# Patient Record
Sex: Female | Born: 1985 | Race: White | Hispanic: No | Marital: Married | Smoking: Never smoker
Health system: Southern US, Community
[De-identification: ages and names within clinical notes are randomized; demographics above are authoritative.]

## PROBLEM LIST (undated history)

## (undated) DIAGNOSIS — F419 Anxiety disorder, unspecified: Secondary | ICD-10-CM

## (undated) DIAGNOSIS — R87629 Unspecified abnormal cytological findings in specimens from vagina: Secondary | ICD-10-CM

## (undated) DIAGNOSIS — F99 Mental disorder, not otherwise specified: Secondary | ICD-10-CM

## (undated) HISTORY — PX: BREAST SURGERY: SHX581

## (undated) HISTORY — PX: COLPOSCOPY: SHX161

---
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
## 2006-03-21 ENCOUNTER — Emergency Department (HOSPITAL_COMMUNITY): Admission: EM | Admit: 2006-03-21 | Discharge: 2006-03-21 | Payer: Self-pay | Admitting: Emergency Medicine

## 2008-05-30 ENCOUNTER — Emergency Department (HOSPITAL_COMMUNITY): Admission: EM | Admit: 2008-05-30 | Discharge: 2008-05-30 | Payer: Self-pay | Admitting: Emergency Medicine

## 2011-06-05 LAB — POCT PREGNANCY, URINE: Preg Test, Ur: NEGATIVE

## 2011-06-05 LAB — URINALYSIS, ROUTINE W REFLEX MICROSCOPIC
Bilirubin Urine: NEGATIVE
Glucose, UA: NEGATIVE
Ketones, ur: NEGATIVE
Nitrite: NEGATIVE
Protein, ur: 100 — AB
Specific Gravity, Urine: 1.023
Urobilinogen, UA: 1
pH: 7

## 2011-06-05 LAB — URINE MICROSCOPIC-ADD ON

## 2011-06-05 LAB — URINE CULTURE: Colony Count: >=100000

## 2017-09-18 LAB — OB RESULTS CONSOLE ABO/RH: RH Type: POSITIVE

## 2017-09-18 LAB — OB RESULTS CONSOLE RPR: RPR: NONREACTIVE

## 2017-09-18 LAB — OB RESULTS CONSOLE RUBELLA ANTIBODY, IGM: Rubella: IMMUNE

## 2017-09-18 LAB — OB RESULTS CONSOLE HIV ANTIBODY (ROUTINE TESTING): HIV: NONREACTIVE

## 2017-09-18 LAB — OB RESULTS CONSOLE HEPATITIS B SURFACE ANTIGEN: Hepatitis B Surface Ag: NEGATIVE

## 2017-09-18 LAB — OB RESULTS CONSOLE ANTIBODY SCREEN: Antibody Screen: NEGATIVE

## 2018-03-05 ENCOUNTER — Inpatient Hospital Stay (HOSPITAL_COMMUNITY): Payer: 59 | Admitting: Anesthesiology

## 2018-03-05 ENCOUNTER — Inpatient Hospital Stay (HOSPITAL_COMMUNITY)
Admission: AD | Admit: 2018-03-05 | Discharge: 2018-03-08 | DRG: 788 | Disposition: A | Discharge status: Home / Self Care | Payer: 59 | Source: Ambulatory Visit | Attending: Obstetrics & Gynecology | Admitting: Obstetrics & Gynecology

## 2018-03-05 ENCOUNTER — Encounter (HOSPITAL_COMMUNITY): Payer: Self-pay

## 2018-03-05 ENCOUNTER — Encounter (HOSPITAL_COMMUNITY)
Admission: AD | Disposition: A | Discharge status: Home / Self Care | Payer: Self-pay | Source: Ambulatory Visit | Attending: Obstetrics & Gynecology

## 2018-03-05 DIAGNOSIS — O4292 Full-term premature rupture of membranes, unspecified as to length of time between rupture and onset of labor: Principal | ICD-10-CM | POA: Diagnosis present

## 2018-03-05 DIAGNOSIS — Z3A37 37 weeks gestation of pregnancy: Secondary | ICD-10-CM | POA: Diagnosis not present

## 2018-03-05 DIAGNOSIS — Z98891 History of uterine scar from previous surgery: Secondary | ICD-10-CM

## 2018-03-05 HISTORY — DX: Anxiety disorder, unspecified: F41.9

## 2018-03-05 HISTORY — DX: Mental disorder, not otherwise specified: F99

## 2018-03-05 HISTORY — DX: Unspecified abnormal cytological findings in specimens from vagina: R87.629

## 2018-03-05 LAB — RPR: RPR Ser Ql: NONREACTIVE

## 2018-03-05 LAB — ABO/RH: ABO/RH(D): A POS

## 2018-03-05 LAB — OB RESULTS CONSOLE GBS: STREP GROUP B AG: NEGATIVE

## 2018-03-05 LAB — CBC
HEMATOCRIT: 31.5 % — AB (ref 36.0–46.0)
HEMOGLOBIN: 11.1 g/dL — AB (ref 12.0–15.0)
MCH: 30.2 pg (ref 26.0–34.0)
MCHC: 35.2 g/dL (ref 30.0–36.0)
MCV: 85.8 fL (ref 78.0–100.0)
Platelets: 226 10*3/uL (ref 150–400)
RBC: 3.67 MIL/uL — ABNORMAL LOW (ref 3.87–5.11)
RDW: 12.8 % (ref 11.5–15.5)
WBC: 10.9 10*3/uL — AB (ref 4.0–10.5)

## 2018-03-05 LAB — TYPE AND SCREEN
ABO/RH(D): A POS
Antibody Screen: NEGATIVE

## 2018-03-05 LAB — POCT FERN TEST: POCT Fern Test: POSITIVE

## 2018-03-05 LAB — GROUP B STREP BY PCR: GROUP B STREP BY PCR: NEGATIVE

## 2018-03-05 SURGERY — Surgical Case
Anesthesia: Epidural

## 2018-03-05 MED ORDER — ACETAMINOPHEN 325 MG PO TABS: 650.0000 mg | ORAL_TABLET | ORAL | Status: DC | PRN

## 2018-03-05 MED ORDER — EPHEDRINE 5 MG/ML INJ: 10.0000 mg | INTRAVENOUS | Status: DC | PRN

## 2018-03-05 MED ORDER — DEXAMETHASONE SODIUM PHOSPHATE 10 MG/ML IJ SOLN
INTRAMUSCULAR | Status: AC
  Filled 2018-03-05: qty 1

## 2018-03-05 MED ORDER — MEPERIDINE HCL 25 MG/ML IJ SOLN: 6.2500 mg | INTRAMUSCULAR | Status: DC | PRN

## 2018-03-05 MED ORDER — PHENYLEPHRINE 40 MCG/ML (10ML) SYRINGE FOR IV PUSH (FOR BLOOD PRESSURE SUPPORT): 80.0000 ug | PREFILLED_SYRINGE | INTRAVENOUS | Status: DC | PRN

## 2018-03-05 MED ORDER — FENTANYL 2.5 MCG/ML BUPIVACAINE 1/10 % EPIDURAL INFUSION (WH - ANES)
INTRAMUSCULAR | Status: AC
  Filled 2018-03-05: qty 100

## 2018-03-05 MED ORDER — TERBUTALINE SULFATE 1 MG/ML IJ SOLN: 0.2500 mg | Freq: Once | INTRAMUSCULAR | Status: DC | PRN

## 2018-03-05 MED ORDER — LACTATED RINGERS IV SOLN: 500.0000 mL | Freq: Once | INTRAVENOUS | Status: DC

## 2018-03-05 MED ORDER — OXYTOCIN 40 UNITS IN LACTATED RINGERS INFUSION - SIMPLE MED
2.5000 [IU]/h | INTRAVENOUS | Status: DC
  Filled 2018-03-05: qty 1000

## 2018-03-05 MED ORDER — OXYCODONE-ACETAMINOPHEN 5-325 MG PO TABS: 2.0000 | ORAL_TABLET | ORAL | Status: DC | PRN

## 2018-03-05 MED ORDER — SOD CITRATE-CITRIC ACID 500-334 MG/5ML PO SOLN
30.0000 mL | ORAL | Status: DC | PRN
  Administered 2018-03-05: 30 mL via ORAL
  Filled 2018-03-05: qty 15

## 2018-03-05 MED ORDER — LIDOCAINE-EPINEPHRINE (PF) 2 %-1:200000 IJ SOLN
INTRAMUSCULAR | Status: DC | PRN
  Administered 2018-03-05 (×3): 5 mL via EPIDURAL

## 2018-03-05 MED ORDER — OXYTOCIN 10 UNIT/ML IJ SOLN
INTRAMUSCULAR | Status: AC
  Filled 2018-03-05: qty 4

## 2018-03-05 MED ORDER — SCOPOLAMINE 1 MG/3DAYS TD PT72
MEDICATED_PATCH | TRANSDERMAL | Status: AC
  Filled 2018-03-05: qty 1

## 2018-03-05 MED ORDER — FENTANYL 2.5 MCG/ML BUPIVACAINE 1/10 % EPIDURAL INFUSION (WH - ANES)
14.0000 mL/h | INTRAMUSCULAR | Status: DC | PRN
  Administered 2018-03-05 (×3): 14 mL/h via EPIDURAL
  Filled 2018-03-05 (×2): qty 100

## 2018-03-05 MED ORDER — LIDOCAINE HCL (PF) 1 % IJ SOLN: 30.0000 mL | INTRAMUSCULAR | Status: DC | PRN

## 2018-03-05 MED ORDER — OXYTOCIN 10 UNIT/ML IJ SOLN
INTRAVENOUS | Status: DC | PRN
  Administered 2018-03-05: 40 [IU] via INTRAVENOUS

## 2018-03-05 MED ORDER — ONDANSETRON HCL 4 MG/2ML IJ SOLN: 4.0000 mg | Freq: Four times a day (QID) | INTRAMUSCULAR | Status: DC | PRN

## 2018-03-05 MED ORDER — PHENYLEPHRINE 40 MCG/ML (10ML) SYRINGE FOR IV PUSH (FOR BLOOD PRESSURE SUPPORT)
PREFILLED_SYRINGE | INTRAVENOUS | Status: AC
  Filled 2018-03-05: qty 10

## 2018-03-05 MED ORDER — DEXAMETHASONE SODIUM PHOSPHATE 10 MG/ML IJ SOLN
INTRAMUSCULAR | Status: DC | PRN
  Administered 2018-03-05: 10 mg via INTRAVENOUS

## 2018-03-05 MED ORDER — METOCLOPRAMIDE HCL 5 MG/ML IJ SOLN: 10.0000 mg | Freq: Once | INTRAMUSCULAR | Status: DC | PRN

## 2018-03-05 MED ORDER — LACTATED RINGERS IV SOLN: INTRAVENOUS | Status: DC

## 2018-03-05 MED ORDER — FENTANYL CITRATE (PF) 100 MCG/2ML IJ SOLN
25.0000 ug | INTRAMUSCULAR | Status: DC | PRN
  Administered 2018-03-06: 50 ug via INTRAVENOUS

## 2018-03-05 MED ORDER — MORPHINE SULFATE (PF) 0.5 MG/ML IJ SOLN
INTRAMUSCULAR | Status: DC | PRN
  Administered 2018-03-05: 4 mg via EPIDURAL

## 2018-03-05 MED ORDER — DIPHENHYDRAMINE HCL 50 MG/ML IJ SOLN: 12.5000 mg | INTRAMUSCULAR | Status: DC | PRN

## 2018-03-05 MED ORDER — MORPHINE SULFATE (PF) 0.5 MG/ML IJ SOLN
INTRAMUSCULAR | Status: AC
  Filled 2018-03-05: qty 10

## 2018-03-05 MED ORDER — LIDOCAINE HCL (PF) 1 % IJ SOLN
INTRAMUSCULAR | Status: DC | PRN
  Administered 2018-03-05 (×2): 5 mL via EPIDURAL

## 2018-03-05 MED ORDER — FLEET ENEMA 7-19 GM/118ML RE ENEM: 1.0000 | ENEMA | Freq: Every day | RECTAL | Status: DC | PRN

## 2018-03-05 MED ORDER — LIDOCAINE-EPINEPHRINE (PF) 2 %-1:200000 IJ SOLN
INTRAMUSCULAR | Status: AC
  Filled 2018-03-05: qty 20

## 2018-03-05 MED ORDER — MEPERIDINE HCL 25 MG/ML IJ SOLN
INTRAMUSCULAR | Status: DC | PRN
  Administered 2018-03-05 (×2): 12.5 mg via INTRAVENOUS

## 2018-03-05 MED ORDER — SCOPOLAMINE 1 MG/3DAYS TD PT72
MEDICATED_PATCH | TRANSDERMAL | Status: DC | PRN
  Administered 2018-03-05: 1 via TRANSDERMAL

## 2018-03-05 MED ORDER — CEFAZOLIN SODIUM-DEXTROSE 2-3 GM-%(50ML) IV SOLR
INTRAVENOUS | Status: DC | PRN
  Administered 2018-03-05: 2 g via INTRAVENOUS

## 2018-03-05 MED ORDER — ONDANSETRON HCL 4 MG/2ML IJ SOLN
INTRAMUSCULAR | Status: AC
  Filled 2018-03-05: qty 2

## 2018-03-05 MED ORDER — CEFAZOLIN SODIUM-DEXTROSE 2-4 GM/100ML-% IV SOLN: 2.0000 g | Freq: Once | INTRAVENOUS | Status: DC

## 2018-03-05 MED ORDER — OXYTOCIN BOLUS FROM INFUSION: 500.0000 mL | Freq: Once | INTRAVENOUS | Status: DC

## 2018-03-05 MED ORDER — LACTATED RINGERS IV SOLN
INTRAVENOUS | Status: DC
  Administered 2018-03-05 (×3): via INTRAVENOUS

## 2018-03-05 MED ORDER — ONDANSETRON HCL 4 MG/2ML IJ SOLN
INTRAMUSCULAR | Status: DC | PRN
  Administered 2018-03-05: 4 mg via INTRAVENOUS

## 2018-03-05 MED ORDER — OXYTOCIN 40 UNITS IN LACTATED RINGERS INFUSION - SIMPLE MED
1.0000 m[IU]/min | INTRAVENOUS | Status: DC
  Administered 2018-03-05: 2 m[IU]/min via INTRAVENOUS

## 2018-03-05 MED ORDER — LACTATED RINGERS IV SOLN: 500.0000 mL | INTRAVENOUS | Status: DC | PRN

## 2018-03-05 MED ORDER — SODIUM BICARBONATE 8.4 % IV SOLN
INTRAVENOUS | Status: AC
  Filled 2018-03-05: qty 50

## 2018-03-05 MED ORDER — OXYCODONE-ACETAMINOPHEN 5-325 MG PO TABS: 1.0000 | ORAL_TABLET | ORAL | Status: DC | PRN

## 2018-03-05 MED ORDER — CEFAZOLIN SODIUM-DEXTROSE 2-4 GM/100ML-% IV SOLN
INTRAVENOUS | Status: AC
  Filled 2018-03-05: qty 100

## 2018-03-05 MED ORDER — MEPERIDINE HCL 25 MG/ML IJ SOLN
INTRAMUSCULAR | Status: AC
  Filled 2018-03-05: qty 1

## 2018-03-05 MED ORDER — SODIUM CHLORIDE 0.9 % IR SOLN
Status: DC | PRN
  Administered 2018-03-05: 1

## 2018-03-05 SURGICAL SUPPLY — 36 items
BENZOIN TINCTURE PRP APPL 2/3 (GAUZE/BANDAGES/DRESSINGS) ×2 IMPLANT
CHLORAPREP W/TINT 26ML (MISCELLANEOUS) ×2 IMPLANT
CLAMP CORD UMBIL (MISCELLANEOUS) IMPLANT
CLOTH BEACON ORANGE TIMEOUT ST (SAFETY) ×2 IMPLANT
DERMABOND ADVANCED (GAUZE/BANDAGES/DRESSINGS)
DERMABOND ADVANCED .7 DNX12 (GAUZE/BANDAGES/DRESSINGS) IMPLANT
DRSG OPSITE POSTOP 4X10 (GAUZE/BANDAGES/DRESSINGS) ×2 IMPLANT
ELECT REM PT RETURN 9FT ADLT (ELECTROSURGICAL) ×2
ELECTRODE REM PT RTRN 9FT ADLT (ELECTROSURGICAL) ×1 IMPLANT
EXTRACTOR VACUUM KIWI (MISCELLANEOUS) IMPLANT
GLOVE BIO SURGEON STRL SZ 6 (GLOVE) ×2 IMPLANT
GLOVE BIOGEL PI IND STRL 6 (GLOVE) ×2 IMPLANT
GLOVE BIOGEL PI IND STRL 7.0 (GLOVE) ×1 IMPLANT
GLOVE BIOGEL PI INDICATOR 6 (GLOVE) ×2
GLOVE BIOGEL PI INDICATOR 7.0 (GLOVE) ×1
GOWN STRL REUS W/TWL LRG LVL3 (GOWN DISPOSABLE) ×4 IMPLANT
KIT ABG SYR 3ML LUER SLIP (SYRINGE) ×2 IMPLANT
NEEDLE HYPO 25X5/8 SAFETYGLIDE (NEEDLE) ×2 IMPLANT
NS IRRIG 1000ML POUR BTL (IV SOLUTION) ×2 IMPLANT
PACK C SECTION WH (CUSTOM PROCEDURE TRAY) ×2 IMPLANT
PAD OB MATERNITY 4.3X12.25 (PERSONAL CARE ITEMS) ×2 IMPLANT
PENCIL SMOKE EVAC W/HOLSTER (ELECTROSURGICAL) ×2 IMPLANT
STRIP CLOSURE SKIN 1/2X4 (GAUZE/BANDAGES/DRESSINGS) ×2 IMPLANT
SUT CHROMIC 0 CTX 36 (SUTURE) ×6 IMPLANT
SUT MON AB 2-0 CT1 27 (SUTURE) ×2 IMPLANT
SUT MON AB 2-0 CT1 36 (SUTURE) ×2 IMPLANT
SUT PDS AB 0 CT1 27 (SUTURE) IMPLANT
SUT PLAIN 0 NONE (SUTURE) IMPLANT
SUT VIC AB 0 CT1 36 (SUTURE) IMPLANT
SUT VIC AB 0 CTX 36 (SUTURE) ×1
SUT VIC AB 0 CTX36XBRD ANBCTRL (SUTURE) ×1 IMPLANT
SUT VIC AB 4-0 KS 27 (SUTURE) ×2 IMPLANT
SYR 50ML LL SCALE MARK (SYRINGE) ×2 IMPLANT
TOWEL OR 17X24 6PK STRL BLUE (TOWEL DISPOSABLE) ×2 IMPLANT
TRAY FOLEY W/BAG SLVR 14FR LF (SET/KITS/TRAYS/PACK) IMPLANT
WATER STERILE IRR 1000ML POUR (IV SOLUTION) ×2 IMPLANT

## 2018-03-05 NOTE — Anesthesia Pain Management Evaluation Note (Signed)
  CRNA Pain Management Visit Note  Patient: Sharon Mccoy, 32 y.o., female  "Hello I am a member of the anesthesia team at [facility]. We have an anesthesia team available at all times to provide care throughout the hospital, including epidural management and anesthesia for C-section. I don't know your plan for the delivery whether it a natural birth, water birth, IV sedation, nitrous supplementation, doula or epidural, but we want to meet your pain goals."   1.Was your pain managed to your expectations on prior hospitalizations?   Yes   2.What is your expectation for pain management during this hospitalization?     Epidural  3.How can we help you reach that goal? Epidural infusing, patient comfortable  Record the patient's initial score and the patient's pain goal.   Pain: 0  Pain Goal: 5 [facility] wants you to be able to say your pain was always managed very well.  [facility] CenterMERRITT,Kristofor Michalowski 03/05/2018

## 2018-03-05 NOTE — Progress Notes (Signed)
Sharon Mccoy is a 32 y.o. G2P0010 at 2124w6d by ultrasound admitted for rupture of membranes  Subjective: Comfortable with epidural.  Objective: BP 123/71   Pulse (!) 59   Temp 98 F (36.7 C) (Oral)   Resp 16   Ht 5\' 6"  (1.676 m)   Wt 179 lb 8 oz (81.4 kg)   SpO2 100%   BMI 28.97 kg/m  No intake/output data recorded. No intake/output data recorded.  FHT:  FHR: 135 bpm, variability: moderate,  accelerations:  Present,  decelerations:  Absent UC:   irregular, every 3-6 minutes SVE:   Dilation: 3 Effacement (%): 70, 80 Station: -2 Exam by:: Dr. Langston MaskerMorris  Labs: Lab Results  Component Value Date   WBC 10.9 (H) 03/05/2018   HGB 11.1 (L) 03/05/2018   HCT 31.5 (L) 03/05/2018   MCV 85.8 03/05/2018   PLT 226 03/05/2018    Assessment / Plan: Spontaneous labor, progressing normally  Labor: Will add pitocin for augmentation Preeclampsia:  n/a Fetal Wellbeing:  Category I Pain Control:  Epidural I/D:  n/a Anticipated MOD:  NSVD  Sharon Mccoy 03/05/2018, 8:06 AM

## 2018-03-05 NOTE — H&P (Signed)
Sharon Mccoy is a 32 y.o. female presenting for SOL.  Rupture of membranes for clear fluid.  Contractions started shortly after.  Pregnancy uncomplicated.  OB History    Gravida  2   Para      Term      Preterm      AB  1   Living        SAB      TAB  1   Ectopic      Multiple      Live Births             Past Medical History:  Diagnosis Date  . Anxiety   . Mental disorder   . Vaginal Pap smear, abnormal    Past Surgical History:  Procedure Laterality Date  . BREAST SURGERY    . COLPOSCOPY     Family History: family history is not on file. Social History:  reports that she has never smoked. She does not have any smokeless tobacco history on file. She reports that she drank alcohol. She reports that she has current or past drug history.     Maternal Diabetes: No Genetic Screening: Declined Maternal Ultrasounds/Referrals: Normal Fetal Ultrasounds or other Referrals:  None Maternal Substance Abuse:  No Significant Maternal Medications:  None Significant Maternal Lab Results:  None Other Comments:  None  ROS History Dilation: 1.5 Effacement (%): 30 Station: -3 Exam by:: Roxan Hockeyraci Lytle RN  Blood pressure 123/75, pulse 60, temperature 98.4 F (36.9 C), temperature source Oral, resp. rate 18, height 5\' 6"  (1.676 m), weight 179 lb 8 oz (81.4 kg), SpO2 100 %. Exam Physical Exam  Gen - NAD Abd - gravid, NT Ext - NT Prenatal labs: ABO, Rh: --/--/A POS, A POS Performed at [facility], [address] [place], [place] [address]  [phone](07/02 0310) Antibody: NEG (07/02 0310) Rubella: Immune (01/15 0000) RPR: Nonreactive (01/15 0000)  HBsAg: Negative (01/15 0000)  HIV: Non-reactive (01/15 0000)  GBS:     Assessment/Plan: Admit Epidural prn Rapid GBS   Zelphia CairoGretchen Li Fragoso 03/05/2018, 6:11 AM

## 2018-03-05 NOTE — Progress Notes (Signed)
Patient has been 9.5 cm for 3 hours now despite adequate MVUs.  I recommend primary C/S for arrest of dilation.  She is counseled re: risk of bleeding, infection, scarring, and damage to surrounding structures.  She understands 1% risk of uterine rupture in subsequent pregnancies.  All questions were answered and the patient wishes to proceed.  Mitchel HonourMegan Chrystal Zeimet, DO

## 2018-03-05 NOTE — Anesthesia Preprocedure Evaluation (Signed)
Anesthesia Evaluation  Patient identified by MRN, date of birth, ID band Patient awake    Reviewed: Allergy & Precautions, H&P , NPO status , Patient's Chart, lab work & pertinent test results  Airway Mallampati: II   Neck ROM: full    Dental   Pulmonary neg pulmonary ROS,    breath sounds clear to auscultation       Cardiovascular negative cardio ROS   Rhythm:regular Rate:Normal     Neuro/Psych PSYCHIATRIC DISORDERS Anxiety    GI/Hepatic   Endo/Other    Renal/GU      Musculoskeletal   Abdominal   Peds  Hematology   Anesthesia Other Findings   Reproductive/Obstetrics (+) Pregnancy                             Anesthesia Physical Anesthesia Plan  ASA: II  Anesthesia Plan: Epidural   Post-op Pain Management:    Induction: Intravenous  PONV Risk Score and Plan: 2 and Treatment may vary due to age or medical condition  Airway Management Planned: Natural Airway  Additional Equipment:   Intra-op Plan:   Post-operative Plan:   Informed Consent: I have reviewed the patients History and Physical, chart, labs and discussed the procedure including the risks, benefits and alternatives for the proposed anesthesia with the patient or authorized representative who has indicated his/her understanding and acceptance.       Plan Discussed with: Anesthesiologist  Anesthesia Plan Comments:         Anesthesia Quick Evaluation  

## 2018-03-05 NOTE — MAU Note (Signed)
At 2230 she got up to pee, then noticed more fluid come out after she finished that was light pink.  Continued to have some gush out every few minutes so she called the nurse line and was told to come in.  Started having some cramping and lower back pain.  + FM.

## 2018-03-05 NOTE — Consult Note (Signed)
Neonatology Note:   Attendance at C-section:    I was asked by Dr. Morris to attend this primary C/S for arrest of dilation at term, 37 6/[redacted] wks EGA. The mother is a G2P0010, GBS neg with good prenatal care uncomplicated. ROM ~24 hours before delivery, fluid clear. Infant vigorous with good spontaneous cry and tone. Needed only minimal bulb suctioning. +60 sec DCC.  Ap 8/9. Lungs clear to ausc in DR. To CN to care of Pediatrician.  David C. Ehrmann, MD 

## 2018-03-05 NOTE — Transfer of Care (Signed)
Immediate Anesthesia Transfer of Care Note  Patient: Sharon Mccoy  Procedure(s) Performed: CESAREAN SECTION (N/A )  Patient Location: PACU  Anesthesia Type:Epidural  Level of Consciousness: awake, alert , oriented and patient cooperative  Airway & Oxygen Therapy: Patient Spontanous Breathing  Post-op Assessment: Report given to RN and Post -op Vital signs reviewed and stable  Post vital signs: Reviewed and stable  Last Vitals:  Vitals Value Taken Time  BP    Temp    Pulse    Resp    SpO2      Last Pain:  Vitals:   03/05/18 2001  TempSrc: Oral  PainSc:          Complications: No apparent anesthesia complications

## 2018-03-05 NOTE — Op Note (Signed)
Sharon Mccoy PROCEDURE DATE: 03/05/2018  PREOPERATIVE DIAGNOSIS: Intrauterine pregnancy at  6867w6d weeks gestation, arrest of dilation  POSTOPERATIVE DIAGNOSIS: The same with OP presentation  PROCEDURE:  Low Transverse Cesarean Section, back-fill bladder with sterile milk  SURGEON:  Dr. Mitchel HonourMegan Nathalya Wolanski  INDICATIONS: Sharon Mccoy is a 32 y.o. G2P0010 at 3167w6d scheduled for cesarean section secondary to arrest of dilation.  The risks of cesarean section discussed with the patient included but were not limited to: bleeding which may require transfusion or reoperation; infection which may require antibiotics; injury to bowel, bladder, ureters or other surrounding organs; injury to the fetus; need for additional procedures including hysterectomy in the event of a life-threatening hemorrhage; placental abnormalities wth subsequent pregnancies, incisional problems, thromboembolic phenomenon and other postoperative/anesthesia complications. The patient concurred with the proposed plan, giving informed written consent for the procedure.    FINDINGS:  Viable female infant in cephalic presentation, APGARs 8,9:  Weight pending  Clear amniotic fluid.  Intact placenta, three vessel cord.  Grossly normal uterus, ovaries and fallopian tubes.  Bladder attached high on uterine corpus approximately 4 cm from fundus. .   ANESTHESIA:    Epidural ESTIMATED BLOOD LOSS: 1116 mL ml SPECIMENS: Placenta sent to L&D COMPLICATIONS: None immediate  PROCEDURE IN DETAIL:  The patient received intravenous antibiotics and had sequential compression devices applied to her lower extremities while in the preoperative area.  She was then taken to the operating room where epidural anesthesia was dosed up to surgical level and was found to be adequate. She was then placed in a dorsal supine position with a leftward tilt, and prepped and draped in a sterile manner.  A foley catheter was placed into her bladder and attached to  constant gravity.  After an adequate timeout was performed, a Pfannenstiel skin incision was made with scalpel and carried through to the underlying layer of fascia. The fascia was incised in the midline and this incision was extended bilaterally using the Mayo scissors. Kocher clamps were applied to the superior aspect of the fascial incision and the underlying rectus muscles were dissected off bluntly. A similar process was carried out on the inferior aspect of the facial incision. The rectus muscles were separated in the midline bluntly and the peritoneum was entered bluntly.  The bladder was identified with attachment high on the uterine corpus.  Bladder flap was created sharply and developed bluntly.  Bladder blade was placed.  A transverse hysterotomy was made with a scalpel and extended bilaterally bluntly. The bladder blade was then removed. The infant was successfully delivered from OP presentation, and cord was clamped and cut and infant was handed over to awaiting neonatology team. Uterine massage was then administered and the placenta delivered intact with three-vessel cord. The uterus was cleared of clot and debris.  The hysterotomy was closed with 0 chromic.  A second imbricating suture of 0-chromic was used to reinforce the incision and aid in hemostasis.  Because of the atypical bladder anatomy, backfill with sterile milk was performed and bladder was competent.  The peritoneum and rectus muscles were noted to be hemostatic and were reapproximated using 2-0  monocryl in a running fashion.  The fascia was closed with 0-Vicryl in a running fashion with good restoration of anatomy.  The subcutaneus tissue was copiously irrigated.  The skin was closed with 4-0 vicryl in a subcuticular fashion.  Pt tolerated the procedure will.  All counts were correct x2.  Pt went to the recovery room in stable  condition.

## 2018-03-05 NOTE — Anesthesia Procedure Notes (Signed)
Epidural Patient location during procedure: OB Start time: 03/05/2018 5:41 AM End time: 03/05/2018 5:49 AM  Staffing Anesthesiologist: Achille RichHodierne, Raun Routh, MD Performed: anesthesiologist   Preanesthetic Checklist Completed: patient identified, site marked, pre-op evaluation, timeout performed, IV checked, risks and benefits discussed and monitors and equipment checked  Epidural Patient position: sitting Prep: DuraPrep Patient monitoring: heart rate, cardiac monitor, continuous pulse ox and blood pressure Approach: midline Location: L2-L3 Injection technique: LOR saline  Needle:  Needle type: Tuohy  Needle gauge: 17 G Needle length: 9 cm Needle insertion depth: 6 cm Catheter type: closed end flexible Catheter size: 19 Gauge Catheter at skin depth: 12 cm Test dose: negative and Other  Assessment Events: blood not aspirated, injection not painful, no injection resistance and negative IV test  Additional Notes Informed consent obtained prior to proceeding including risk of failure, 1% risk of PDPH, risk of minor discomfort and bruising.  Discussed rare but serious complications including epidural abscess, permanent nerve injury, epidural hematoma.  Discussed alternatives to epidural analgesia and patient desires to proceed.  Timeout performed pre-procedure verifying patient name, procedure, and platelet count.  Patient tolerated procedure well. Reason for block:procedure for pain

## 2018-03-06 ENCOUNTER — Encounter (HOSPITAL_COMMUNITY): Payer: Self-pay | Admitting: Obstetrics & Gynecology

## 2018-03-06 DIAGNOSIS — Z98891 History of uterine scar from previous surgery: Secondary | ICD-10-CM

## 2018-03-06 LAB — CBC
HCT: 26.3 % — ABNORMAL LOW (ref 36.0–46.0)
HEMOGLOBIN: 9.4 g/dL — AB (ref 12.0–15.0)
MCH: 30.8 pg (ref 26.0–34.0)
MCHC: 35.7 g/dL (ref 30.0–36.0)
MCV: 86.2 fL (ref 78.0–100.0)
PLATELETS: 210 10*3/uL (ref 150–400)
RBC: 3.05 MIL/uL — ABNORMAL LOW (ref 3.87–5.11)
RDW: 12.9 % (ref 11.5–15.5)
WBC: 21.6 10*3/uL — ABNORMAL HIGH (ref 4.0–10.5)

## 2018-03-06 MED ORDER — WITCH HAZEL-GLYCERIN EX PADS: 1.0000 "application " | MEDICATED_PAD | CUTANEOUS | Status: DC | PRN

## 2018-03-06 MED ORDER — DIBUCAINE 1 % RE OINT: 1.0000 "application " | TOPICAL_OINTMENT | RECTAL | Status: DC | PRN

## 2018-03-06 MED ORDER — SIMETHICONE 80 MG PO CHEW
80.0000 mg | CHEWABLE_TABLET | Freq: Three times a day (TID) | ORAL | Status: DC
  Administered 2018-03-06 – 2018-03-08 (×7): 80 mg via ORAL
  Filled 2018-03-06 (×7): qty 1

## 2018-03-06 MED ORDER — SENNOSIDES-DOCUSATE SODIUM 8.6-50 MG PO TABS
2.0000 | ORAL_TABLET | ORAL | Status: DC
  Administered 2018-03-06 – 2018-03-08 (×2): 2 via ORAL
  Filled 2018-03-06 (×2): qty 2

## 2018-03-06 MED ORDER — OXYCODONE-ACETAMINOPHEN 5-325 MG PO TABS
2.0000 | ORAL_TABLET | ORAL | Status: DC | PRN
  Administered 2018-03-06 – 2018-03-07 (×4): 2 via ORAL
  Filled 2018-03-06 (×4): qty 2

## 2018-03-06 MED ORDER — PRENATAL MULTIVITAMIN CH
1.0000 | ORAL_TABLET | Freq: Every day | ORAL | Status: DC
  Administered 2018-03-06 – 2018-03-07 (×2): 1 via ORAL
  Filled 2018-03-06 (×2): qty 1

## 2018-03-06 MED ORDER — IBUPROFEN 600 MG PO TABS
600.0000 mg | ORAL_TABLET | Freq: Four times a day (QID) | ORAL | Status: DC
  Administered 2018-03-06 – 2018-03-08 (×9): 600 mg via ORAL
  Filled 2018-03-06 (×9): qty 1

## 2018-03-06 MED ORDER — TETANUS-DIPHTH-ACELL PERTUSSIS 5-2.5-18.5 LF-MCG/0.5 IM SUSP: 0.5000 mL | Freq: Once | INTRAMUSCULAR | Status: DC

## 2018-03-06 MED ORDER — ZOLPIDEM TARTRATE 5 MG PO TABS: 5.0000 mg | ORAL_TABLET | Freq: Every evening | ORAL | Status: DC | PRN

## 2018-03-06 MED ORDER — SIMETHICONE 80 MG PO CHEW: 80.0000 mg | CHEWABLE_TABLET | ORAL | Status: DC | PRN

## 2018-03-06 MED ORDER — ACETAMINOPHEN 325 MG PO TABS: 650.0000 mg | ORAL_TABLET | ORAL | Status: DC | PRN

## 2018-03-06 MED ORDER — COCONUT OIL OIL
1.0000 "application " | TOPICAL_OIL | Status: DC | PRN
  Administered 2018-03-07: 1 via TOPICAL
  Filled 2018-03-06: qty 120

## 2018-03-06 MED ORDER — MENTHOL 3 MG MT LOZG: 1.0000 | LOZENGE | OROMUCOSAL | Status: DC | PRN

## 2018-03-06 MED ORDER — DIPHENHYDRAMINE HCL 25 MG PO CAPS: 25.0000 mg | ORAL_CAPSULE | Freq: Four times a day (QID) | ORAL | Status: DC | PRN

## 2018-03-06 MED ORDER — SIMETHICONE 80 MG PO CHEW
80.0000 mg | CHEWABLE_TABLET | ORAL | Status: DC
  Administered 2018-03-06 – 2018-03-08 (×2): 80 mg via ORAL
  Filled 2018-03-06 (×2): qty 1

## 2018-03-06 MED ORDER — FENTANYL CITRATE (PF) 100 MCG/2ML IJ SOLN
INTRAMUSCULAR | Status: AC
  Filled 2018-03-06: qty 2

## 2018-03-06 MED ORDER — OXYCODONE-ACETAMINOPHEN 5-325 MG PO TABS
1.0000 | ORAL_TABLET | ORAL | Status: DC | PRN
  Administered 2018-03-06 – 2018-03-08 (×3): 1 via ORAL
  Filled 2018-03-06 (×4): qty 1

## 2018-03-06 MED ORDER — LACTATED RINGERS IV SOLN
INTRAVENOUS | Status: DC
  Administered 2018-03-06: 10:00:00 via INTRAVENOUS

## 2018-03-06 MED ORDER — OXYTOCIN 40 UNITS IN LACTATED RINGERS INFUSION - SIMPLE MED: 2.5000 [IU]/h | INTRAVENOUS | Status: AC

## 2018-03-06 NOTE — Anesthesia Postprocedure Evaluation (Signed)
Anesthesia Post Note  Patient: Sharon Mccoy  Procedure(s) Performed: CESAREAN SECTION (N/A )     Patient location during evaluation: Mother Baby Anesthesia Type: Epidural Level of consciousness: awake Pain management: pain level controlled Vital Signs Assessment: post-procedure vital signs reviewed and stable Respiratory status: spontaneous breathing Cardiovascular status: stable Postop Assessment: no headache, no backache, epidural receding, patient able to bend at knees, adequate PO intake, no apparent nausea or vomiting and able to ambulate Anesthetic complications: no    Last Vitals:  Vitals:   03/06/18 0500 03/06/18 0855  BP: 103/67 111/65  Pulse: 64 60  Resp:    Temp: 37 C 36.9 C  SpO2: 97% 99%    Last Pain:  Vitals:   03/06/18 0855  TempSrc: Axillary  PainSc:    Pain Goal:                 Montford Barg

## 2018-03-06 NOTE — Progress Notes (Signed)
Subjective: Postpartum Day 1: Cesarean Delivery Patient reports tolerating PO.    Objective: Vital signs in last 24 hours: Temp:  [98.1 F (36.7 C)-99.7 F (37.6 C)] 98.5 F (36.9 C) (07/03 0855) Pulse Rate:  [57-95] 60 (07/03 0855) Resp:  [15-21] 18 (07/03 0100) BP: (95-154)/(48-82) 111/65 (07/03 0855) SpO2:  [93 %-99 %] 99 % (07/03 0855)  Physical Exam:  General: alert, cooperative, appears stated age and no distress Lochia: appropriate Uterine Fundus: firm Incision: healing well DVT Evaluation: No evidence of DVT seen on physical exam.  Recent Labs    03/05/18 0310 03/06/18 0642  HGB 11.1* 9.4*  HCT 31.5* 26.3*    Assessment/Plan: Status post Cesarean section. Doing well postoperatively.  Continue current care.  Sherrilynn Gudgel C 03/06/2018, 9:12 AM

## 2018-03-06 NOTE — Lactation Note (Signed)
This note was copied from a baby's chart. Lactation Consultation Note  Patient Name: Sharon Mccoy Today's Date: 03/06/2018 Reason for consult: Initial assessment;Early term 37-38.6wks;1st time breastfeeding;Primapara  P1 mother whose infant is now 153 hours old.  RN Request for Latch Assistance:  Baby being held by mother as I entered.  I offered to assist with latch and mother accepted.  Mother's breasts are soft and non tender with short shafted nipples bilaterally.  Mother has a #16 NS at bedside that she used in PACU.  I assisted to latch in the football hold on the right breast.  After a couple attempts baby began sucking.  She required constant stimulation to continue sucking at the breast.  Demonstrated techniques to help keep baby awake and mother fed on/off for 10 minutes before baby fell asleep.  Provided breast shells and manual pump with instructions for use.  Mother will begin using shells later this morning after getting a bra on.  She will pre pump before latching with the manual pump to help evert her nipples for latching.    Encouraged mother to feed 8-12 times/24 hours or more if baby shows feeding cues.  Reviewed feeding cues.  Continue STS, breast massage and hand expression.  Mother did a return demonstration of hand expression and was able to obtain a few drops of colostrum.  Mom made aware of O/P services, breastfeeding support groups, community resources, and our phone # for post-discharge questions. Father present and sleeping on couch.  Mother will call for assistance as needed.  RN updated.   Maternal Data Formula Feeding for Exclusion: No Has patient been taught Hand Expression?: Yes Does the patient have breastfeeding experience prior to this delivery?: No  Feeding Feeding Type: Breast Fed Length of feed: 10 min  LATCH Score Latch: Repeated attempts needed to sustain latch, nipple held in mouth throughout feeding, stimulation needed to elicit sucking  reflex.  Audible Swallowing: None  Type of Nipple: Inverted  Comfort (Breast/Nipple): Soft / non-tender  Hold (Positioning): Assistance needed to correctly position infant at breast and maintain latch.  LATCH Score: 4  Interventions Interventions: Breast feeding basics reviewed;Assisted with latch;Skin to skin;Breast massage;Hand express;Position options;Pre-pump if needed;Adjust position;Breast compression;Shells;Hand pump  Lactation Tools Discussed/Used Tools: Shells;Pump;Nipple Shields Nipple shield size: 16 Shell Type: Inverted Breast pump type: Manual   Consult Status Consult Status: Follow-up Date: 03/07/18 Follow-up type: In-patient    Sharon Mccoy R Cesario Weidinger 03/06/2018, 2:03 AM

## 2018-03-07 NOTE — Progress Notes (Signed)
Patient doing well  No complaints.  BP (!) 108/57 (BP Location: Left Arm)   Pulse 67   Temp 98.2 F (36.8 C) (Oral)   Resp 18   Ht 5\' 6"  (1.676 m)   Wt 81.4 kg (179 lb 8 oz)   SpO2 97%   BMI 28.97 kg/m  No results found for this or any previous visit (from the past 24 hour(s)). Abdomen is soft and non tender  POD #  2  Doing well Routine care Discharge home tomorrow

## 2018-03-07 NOTE — Lactation Note (Signed)
This note was copied from a baby's chart. Lactation Consultation Note  Patient Name: Sharon Mccoy Today's Date: 03/07/2018    Infant is 36 weeks & Mom had a bilateral augmentation 4 years ago. She says that the surgeon told her that her breasts were "tuberous." Mom reports mild breast changes with pregnancy. We were able to hand express a small amount of colostrum. Mom understands that we can maximize her milk supply by using a DEBP.  Mom's nipples are bruised (L nipple more so than R nipple. L nipple appears to have a slight crack across surface of nipple). Mom had been using a size 16 NS; I provided a size 20 NS & showed her how to apply it.   Infant was cueing. I offered to assist w/breastfeeding, but Mom chose to a give bottle of formula, instead. Mom knows that she should pump every time infant receives formula.   Mom was shown how to use DEBP & how to disassemble, clean, & reassemble parts. Mom was shown how to assemble & use hand pump (single-mode) that was included in pump kit. We began a pumping session with size 21 flanges, but visitors arrived.I asked the nurse to provide coconut oil so Mom could lubricate the flanges to increase comfort.   Matthias Hughs [facility] 03/07/2018, 5:01 PM

## 2018-03-08 ENCOUNTER — Encounter (HOSPITAL_COMMUNITY): Payer: Self-pay | Admitting: *Deleted

## 2018-03-08 MED ORDER — OXYCODONE-ACETAMINOPHEN 5-325 MG PO TABS: 1.0000 | ORAL_TABLET | ORAL | 0 refills | Status: AC | PRN

## 2018-03-08 MED ORDER — IBUPROFEN 600 MG PO TABS: 600.0000 mg | ORAL_TABLET | Freq: Four times a day (QID) | ORAL | 0 refills | Status: AC

## 2018-03-08 NOTE — Discharge Summary (Signed)
Obstetric Discharge Summary Reason for Admission: rupture of membranes Prenatal Procedures: none Intrapartum Procedures: cesarean: low cervical, transverse Postpartum Procedures: none Complications-Operative and Postpartum: none Hemoglobin  Date Value Ref Range Status  03/06/2018 9.4 (L) 12.0 - 15.0 g/dL Final   HCT  Date Value Ref Range Status  03/06/2018 26.3 (L) 36.0 - 46.0 % Final    Physical Exam:  General: alert, cooperative and appears stated age 65Lochia: appropriate Uterine Fundus: firm Incision: healing well, no significant drainage, no dehiscence DVT Evaluation: No evidence of DVT seen on physical exam.  Discharge Diagnoses: Term Pregnancy-delivered  Discharge Information: Date: 03/08/2018 Activity: pelvic rest Diet: routine Medications: Ibuprofen and Percocet Condition: improved Instructions: refer to practice specific booklet Discharge to: home   Newborn Data: Live born female  Birth Weight: 7 lb 0.2 oz (3180 g) APGAR: 8, 9  Newborn Delivery   Birth date/time:  03/05/2018 22:20:00 Delivery type:  C-Section, Low Transverse Trial of labor:  Yes C-section categorization:  Primary     Home with mother.  Nickson Middlesworth L 03/08/2018, 7:40 AM

## 2018-03-08 NOTE — Lactation Note (Signed)
This note was copied from a baby's chart. Lactation Consultation Note; Mom reports her nipples are sore especially the left one. They both are flat and red. Pumped once yesterday with DEBP. Has given bottles of formula through the night. Breasts are beginning to feel fuller. Suggested pumping and mom agreeable. Assisted with pumping- using #21 flanges- They are good fit. Plans to get pump from insurance company and has a friend she is going to borrow one from until she can get one. Reviewed importance of frequent  pumping to prevent engorgement and promote milk supply. States she will continue trying to latch baby at home. Reviewed our phone number, OP appointments and BFSG as resoucres for support after DC. Encouraged to make OP appointment for assist. No questions at present. To call prn  Patient Name: Girl Marcello FennelDema Alviar UJWJX'BToday's Date: 03/08/2018 Reason for consult: Follow-up assessment;Early term 37-38.6wks   Maternal Data Has patient been taught Hand Expression?: Yes Does the patient have breastfeeding experience prior to this delivery?: No  Feeding Feeding Type: Bottle Fed - Formula Nipple Type: Slow - flow  LATCH Score                   Interventions Interventions: Breast feeding basics reviewed;Expressed milk;Coconut oil  Lactation Tools Discussed/Used Tools: Coconut oil;Nipple Shields Breast pump type: Double-Electric Breast Pump WIC Program: No   Consult Status Consult Status: Complete    Pamelia HoitWeeks, Kayliegh Boyers D 03/08/2018, 9:26 AM

## 2018-03-08 NOTE — Progress Notes (Signed)
CSW received consult for hx of Anxiety.  CSW met with MOB to offer support and complete assessment.    When CSW arrived, MOB was resting in the recliner, FOB was resting on the couch and infant was asleep in the bassinet. CSW explained CSW role and MOB gave CSW permission to complete the assessment while FOB was pregnant. MOB and FOB was supportive of one another and appeared to be excited about being new parents. MOB was polite and receptive to meeting with CSW.   CSW inquired about MOB's MH hx and MOB openly shared being dx with anxiety/panic attacks as a teen.  MOB also shared that MOB was so excited about pregnancy confirmation that MOB experienced a panic attack.  MOB denied any symptoms since first trimester and declined resources for outpatient counseling. MOB and FOB shared that they have a great support team that consist of their immediate and extended family members.   CSW provided education regarding the baby blues period vs. perinatal mood disorders, discussed treatment and gave resources for mental health follow up if concerns arise.  CSW recommends self-evaluation during the postpartum time period using the New Mom Checklist from [employer] and encouraged MOB to contact a medical professional if symptoms are noted at any time.  MOB appeared to have insight and awareness about her mental health and denied SI and HI.  CSW identifies no further need for intervention and no barriers to discharge at this time.  Laurey Arrow, MSW, LCSW Clinical Social Work [phone]
